# Patient Record
Sex: Male | Born: 1955 | Race: White | Hispanic: No | Marital: Single
Health system: Southern US, Community
[De-identification: ages and names within clinical notes are randomized; demographics above are authoritative.]

---

## 1999-05-24 ENCOUNTER — Encounter: Payer: Self-pay | Admitting: General Surgery

## 1999-05-24 ENCOUNTER — Encounter: Admission: RE | Admit: 1999-05-24 | Discharge: 1999-05-24 | Payer: Self-pay | Admitting: General Surgery

## 2001-09-03 ENCOUNTER — Ambulatory Visit (HOSPITAL_BASED_OUTPATIENT_CLINIC_OR_DEPARTMENT_OTHER): Admission: RE | Admit: 2001-09-03 | Discharge: 2001-09-03 | Payer: Self-pay | Admitting: Dentistry

## 2006-08-26 ENCOUNTER — Emergency Department (HOSPITAL_COMMUNITY): Admission: EM | Admit: 2006-08-26 | Discharge: 2006-08-26 | Payer: Self-pay | Admitting: Emergency Medicine

## 2008-12-29 ENCOUNTER — Emergency Department (HOSPITAL_COMMUNITY): Admission: EM | Admit: 2008-12-29 | Discharge: 2008-12-29 | Payer: Self-pay | Admitting: Emergency Medicine

## 2009-06-12 ENCOUNTER — Ambulatory Visit (HOSPITAL_COMMUNITY): Admission: RE | Admit: 2009-06-12 | Discharge: 2009-06-12 | Payer: Self-pay

## 2009-11-22 ENCOUNTER — Ambulatory Visit: Payer: Self-pay | Admitting: Thoracic Surgery

## 2009-11-27 ENCOUNTER — Ambulatory Visit (HOSPITAL_COMMUNITY): Admission: RE | Admit: 2009-11-27 | Discharge: 2009-11-27 | Payer: Self-pay | Admitting: Thoracic Surgery

## 2009-11-30 ENCOUNTER — Ambulatory Visit (HOSPITAL_COMMUNITY): Admission: RE | Admit: 2009-11-30 | Discharge: 2009-11-30 | Payer: Self-pay | Admitting: Thoracic Surgery

## 2009-12-05 ENCOUNTER — Ambulatory Visit: Payer: Self-pay | Admitting: Thoracic Surgery

## 2009-12-09 ENCOUNTER — Emergency Department (HOSPITAL_COMMUNITY): Admission: EM | Admit: 2009-12-09 | Discharge: 2009-12-09 | Payer: Self-pay | Admitting: Emergency Medicine

## 2009-12-15 ENCOUNTER — Ambulatory Visit (HOSPITAL_COMMUNITY)
Admission: RE | Admit: 2009-12-15 | Discharge: 2009-12-16 | Payer: Self-pay | Source: Home / Self Care | Admitting: Thoracic Surgery

## 2009-12-15 ENCOUNTER — Encounter: Payer: Self-pay | Admitting: Thoracic Surgery

## 2009-12-15 ENCOUNTER — Ambulatory Visit: Payer: Self-pay | Admitting: Thoracic Surgery

## 2009-12-20 ENCOUNTER — Ambulatory Visit: Payer: Self-pay | Admitting: Thoracic Surgery

## 2010-01-10 ENCOUNTER — Ambulatory Visit: Payer: Self-pay | Admitting: Thoracic Surgery

## 2010-01-22 ENCOUNTER — Ambulatory Visit (HOSPITAL_COMMUNITY)
Admission: RE | Admit: 2010-01-22 | Discharge: 2010-01-22 | Payer: Self-pay | Source: Home / Self Care | Attending: Hematology and Oncology | Admitting: Hematology and Oncology

## 2010-04-24 LAB — URINALYSIS, ROUTINE W REFLEX MICROSCOPIC
Bilirubin Urine: NEGATIVE
Hgb urine dipstick: NEGATIVE
Nitrite: NEGATIVE
Specific Gravity, Urine: 1.008 (ref 1.005–1.030)
pH: 7.5 (ref 5.0–8.0)

## 2010-04-24 LAB — COMPREHENSIVE METABOLIC PANEL
AST: 16 U/L (ref 0–37)
Albumin: 3 g/dL — ABNORMAL LOW (ref 3.5–5.2)
Alkaline Phosphatase: 73 U/L (ref 39–117)
Chloride: 105 mEq/L (ref 96–112)
GFR calc Af Amer: >60 mL/min (ref >60)
Potassium: 4.3 mEq/L (ref 3.5–5.1)
Total Bilirubin: 0.4 mg/dL (ref 0.3–1.2)

## 2010-04-24 LAB — TYPE AND SCREEN
Antibody Screen: NEGATIVE
Unit division: 0

## 2010-04-24 LAB — BLOOD GAS, ARTERIAL
Acid-Base Excess: 2.3 mmol/L — ABNORMAL HIGH (ref 0.0–2.0)
Drawn by: 333511
O2 Saturation: 96.8 %
pO2, Arterial: 87.2 mmHg (ref 80.0–100.0)

## 2010-04-24 LAB — SURGICAL PCR SCREEN
MRSA, PCR: POSITIVE — AB
Staphylococcus aureus: POSITIVE — AB

## 2010-04-24 LAB — GLUCOSE, CAPILLARY: Glucose-Capillary: 87 mg/dL (ref 70–99)

## 2010-04-24 LAB — CBC
Platelets: 330 10*3/uL (ref 150–400)
RBC: 4.3 MIL/uL (ref 4.22–5.81)
WBC: 6.1 10*3/uL (ref 4.0–10.5)

## 2010-04-24 LAB — APTT: aPTT: 36 seconds (ref 24–37)

## 2010-04-24 LAB — CULTURE, RESPIRATORY W GRAM STAIN

## 2010-04-25 LAB — CBC
HCT: 39.4 % (ref 39.0–52.0)
MCH: 27.9 pg (ref 26.0–34.0)
MCV: 86 fL (ref 78.0–100.0)
Platelets: 260 10*3/uL (ref 150–400)
RBC: 4.58 MIL/uL (ref 4.22–5.81)

## 2010-04-25 LAB — POCT I-STAT, CHEM 8
BUN: 11 mg/dL (ref 6–23)
Chloride: 102 mEq/L (ref 96–112)
Creatinine, Ser: 0.8 mg/dL (ref 0.4–1.5)
Potassium: 4.6 mEq/L (ref 3.5–5.1)
Sodium: 136 mEq/L (ref 135–145)
TCO2: 30 mmol/L (ref 0–100)

## 2010-06-26 NOTE — Letter (Signed)
December 05, 2009   C. Malon Kindle, MD  [address]  [place], [place]  [address]   Re:  DEMARKUS, REMMEL              DOB:  08/13/1955   Dear Dr. Marisa Sprinkles:   I saw the patient back today.  His PET scan showed just uptake in the  area of the large anterior mediastinal mass which is good.  His  pulmonary function tests were satisfactory.  Unfortunately, the needle  biopsy we did was nondiagnostic.  They saw some atypical cells with  lymphoid tissue, but could not make a diagnosis of cancer and requested  additional material.  For this reason, we will take him to the operating  room on December 15, 2009, to do a median sternotomy, this was a  Field seismologist procedure and do a direct biopsy of the mass, but obviously  we need to know exactly what we are dealing with, whether it is in terms  of tumor, lymphoma, thymic cancer, or thymoma prior to his surgery.   Ines Bloomer, M.D.  Electronically Signed   DPB/MEDQ  D:  12/05/2009  T:  12/06/2009  Job:  [id number]

## 2010-06-26 NOTE — Letter (Signed)
November 22, 2009   C. Malon Kindle, MD  [phone]  [place]. [place]  [address]   Re:  [name], [name]              DOB:  18-Jul-1955   Dear Dr. Marisa [name]:   I appreciate the opportunity of seeing the patient.  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  CT scan showed some  supraclavicular adenopathy and [name] large left mediastinal mass.  CT scan  of the brain was negative and he is referred here for evaluation.  He  has the left greater than right supraclavicular adenopathy on CT scan of  the neck.  He has had no hemoptysis, fever, chills, or excessive sputum.  He has had weight loss.  The CT scan of the mass shows that the left  brachial cephalic vein is encased with [name] questionable thrombus and that  also goes into the aortopulmonary window.   CURRENT MEDICATIONS:  1. Lisinopril/hydrochlorothiazide 10/12.5 for hypertension.  2. Voltaren transdermal gel for joint pain.  3. He had been on prednisone taper that has been stopped and he had      also been on nitrofurantoin caps as well as Bactrim.   PAST MEDICAL HISTORY:  He has been known to have BPH, chronic  constipation, esophageal reflux, H. pylori, neurogenic bladder,  psoriasis, [place] disease or familial HDL deficiency, and tinea cruris.  He also was found to have [name] hydrocele.   PAST SURGICAL HISTORY:  Cystoscopies, foot surgery for gangrene cyst,  and hiatal hernia repair.   FAMILY HISTORY:  Positive for hypertension, cancer, and arthritis.   SOCIAL HISTORY:  He is single.  He works as [name] Librarian, academic.  Does not  drink.  Does not smoke.   ALLERGIES:  He is allergic to Cipro and Levaquin.   REVIEW OF SYSTEMS:  VITAL SIGNS:  He is 165 pounds, he is 6 feet 1 inch  tall.  GENERAL:  He has had weight loss.  CARDIAC:  No angina or atrial fibrillation.  PULMONARY:  No hemoptysis.  GI:  Constipation, abdominal pain, and  reflux.  GU:  No kidney disease, dysuria, or frequent urination with the history  of [name] neurogenic bladder.  VASCULAR:  No claudication, DVT, or TIAs.  NEUROLOGICAL:  No dizziness, headaches, blackouts, or seizures.  MUSCULOSKELETAL:  Joint pain.  PSYCHIATRIC:  No depression or nervousness.  EYE/ENT:  No changes in eyesight or hearing.  HEMATOLOGICAL:  No problems with bleeding, clotting disorders, or  anemia.   PHYSICAL EXAMINATION:  General:  He is [name] thin Caucasian male, in no  acute distress.  Vital Signs:  His blood pressure was 148/99, pulse 100,  respirations 18, and sats are 99%.  He is 6 feet and 1 inch.  Head,  Eyes, Ears, Nose, and Throat:  Unremarkable.  Neck:  Supple without  thyromegaly.  There is no supraclavicular or axillary adenopathy.  Chest:  Clear to auscultation and percussion.  Heart:  Regular sinus  rhythm.  No murmurs.  Abdomen:  Soft.  There is no hepatosplenomegaly.  Neck:  There are some nodes in the left and right side, none that are  very enlarged.  Extremities:  Pulses are 2+.  There is no clubbing or  edema.   I think, unfortunately the patient has got probably [name] far advanced  thymic mass.  The differential diagnosis is  probably thymoma, lymphoma,  thymic cancer, teratoma, germ cell tumor, or non-small cell lung cancer.  We plan to get [name] PET scan on him and then proceed with [name] biopsy, but I  doubt that he is not [name] candidate for resection, we will probably need to  be treated with radiation and chemotherapy.   [name], M.D.  Electronically Signed   DPB/MEDQ  D:  11/22/2009  T:  11/23/2009  Job:  [id number]   cc:   Terrance Lobo, MD

## 2010-06-26 NOTE — Letter (Signed)
December 20, 2009   Malon Kindle, MD  [address]  [place], [place]  [address]   Re:  [name], [name]              DOB:  06/06/1955   Dear Dr. Marisa Sprinkles:   I saw the patient in my office today after his mediastinotomy.  His  blood pressure is 158/94, pulse 100, respirations 20, saturations were  98%.  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  I  will refer him to Dr. Gypsy Lore at the [facility] for  treatment and he will see him next week.  I will see him back again in 3  weeks to check on healing of his wound.   I appreciate the opportunity of seeing the patient.   Ines Bloomer, M.D.  Electronically Signed   DPB/MEDQ  D:  12/20/2009  T:  12/21/2009  Job:  [id number]   cc:   Josiah Lobo

## 2010-06-26 NOTE — Letter (Signed)
January 10, 2010   Silvestre Moment, MD  [address]  [place], [place] [address]   Re:  [name], [name]              DOB:  1955/08/05   Dear Dr. Gypsy Lore:   I appreciate you taking care of the patient.  I saw him back today and  his mediastinotomy site is well-healed.  Also, he has a port in place  which appears to be functioning well.  His blood pressure is 102/60,  pulse 90, respirations 18, and sats were 98%.  Apparently, he had to  forgo his second cycle of chemo because of a low white count of 680.  We  plan to, but these show no signs of infection at the present time.  We  will see him back again if he has any future problems.  Apparently, he  is undergoing some therapy for aspiration from his nerve palsy.   Ines Bloomer, M.D.  Electronically Signed   DPB/MEDQ  D:  01/10/2010  T:  01/11/2010  Job:  [id number]

## 2012-06-23 IMAGING — CT NM PET TUM IMG RESTAG (PS) SKULL BASE T - THIGH
6 series · 25 of 25 positions shown · IV contrast (350 0M)
Comparison: Diagnostic chest CT 12/09/2009.  PET of 11/27/2009.

CLINICAL DATA: Subsequent treatment strategy for lymphoma.  Status
post chemotherapy.  Last treatment finished last week.

NUCLEAR MEDICINE PET CT SKULL BASE TO THIGH
TECHNIQUE: 18.4 mCi F-18 FDG was injected intravenously via the
right AC.  Full-ring PET imaging was performed from the skull base
through the mid-thighs 70  minutes after injection.  CT data was
obtained and used for attenuation correction and anatomic
localization only.  (This was not acquired as a diagnostic CT
examination.)
Fasting Blood Glucose:  87

[Series 1: pet ac · axial · 3.3mm · 4.69mm/px · z∈[-870,+0]mm · 5 of 267 slices shown]
[im 1/267]
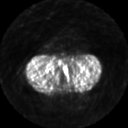
[im 67/267]
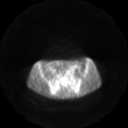
[im 134/267]
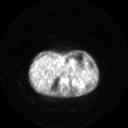
[im 200/267]
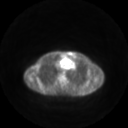
[im 267/267]
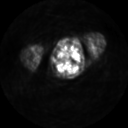

[Series 2: pet nac · axial · 3.3mm · 4.69mm/px · z∈[-870,+0]mm · 6 of 267 slices shown]
[im 1/267]
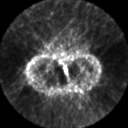
[im 54/267]
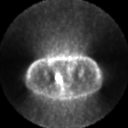
[im 107/267]
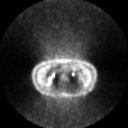
[im 160/267]
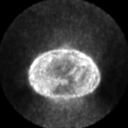
[im 213/267]
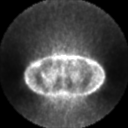
[im 267/267]
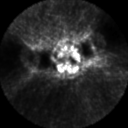

[Series 2: ct images · axial · 3.8mm · 0.98mm/px · z∈[-870,+0]mm · 6 of 267 slices shown]
[im 1/267]
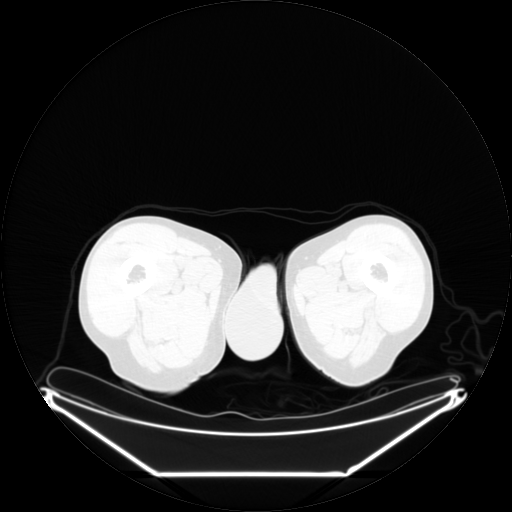
[im 54/267]
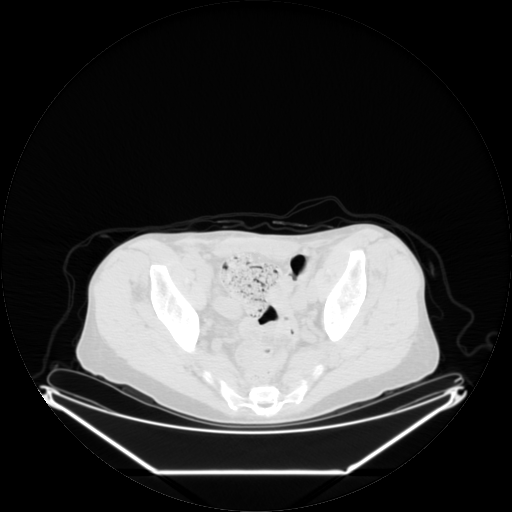
[im 107/267]
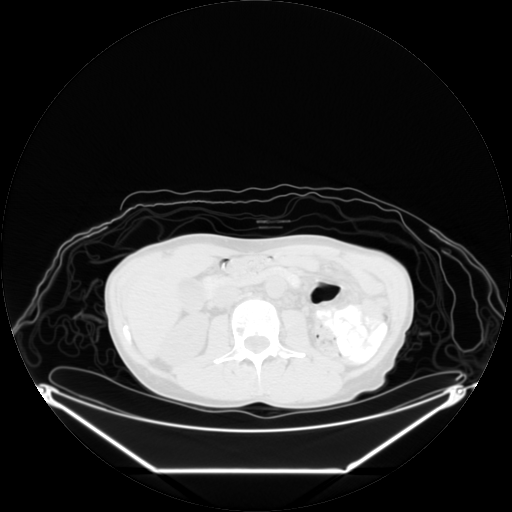
[im 160/267]
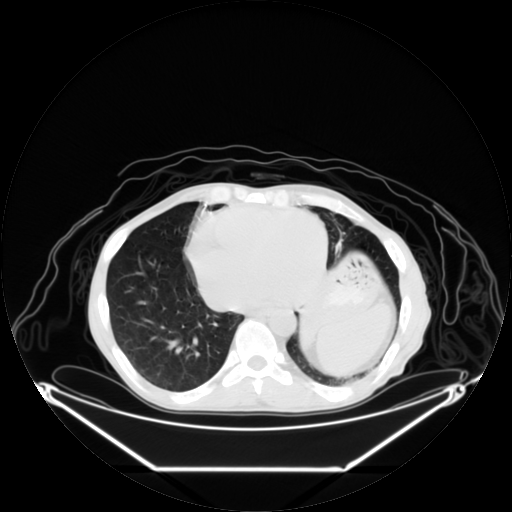
[im 213/267]
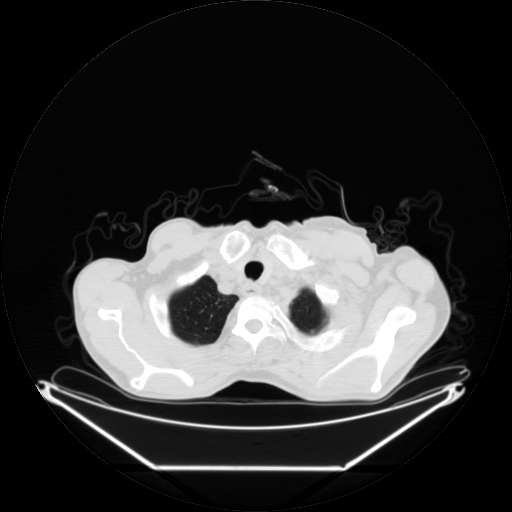
[im 267/267  brain]
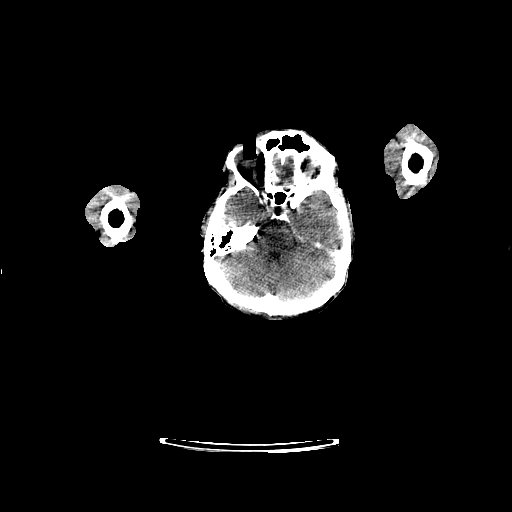

[Series 123: mip · coronal · 3.3mm · 4.69mm/px · 1 of 30 slices shown]
[im 1/30]
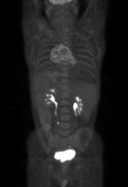

[Series 151: reformatted · axial · 3.3mm · 3.91mm/px · z∈[-870,+0]mm · 6 of 265 slices shown (1 of 2)]
[im 1/265]
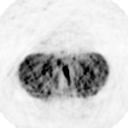
[im 53/265]
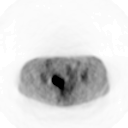
[im 106/265]
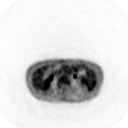
[im 159/265]
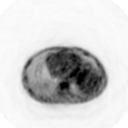
[im 212/265]
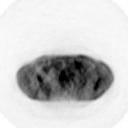
[im 265/265]
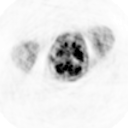

[Series 153: reformatted · coronal · 4.7mm · 6.98mm/px · 1 of 59 slices shown (2 of 2)]
[im 1/59]
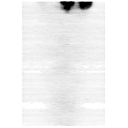

[25 of 25 positions shown; findings below may reference images not displayed]

FINDINGS: PET images demonstrate decreased asymmetry with relative
increased activity about the right cord vocal cord.  No abnormal
nodal activity within the neck.

Muscular/physiologic activity about the left pectoral musculature.

There is hypermetabolism about the skin and subcutaneous tissues of
the anterior left chest.  Possible concurrent subcutaneous
edema/increased density in this area.  This measures a S.U.V. max
of 4.1 on image 66.

The low density, heterogeneous, centrally necrotic anterior
mediastinal mass is again identified.  This measures maximally
x 6.7 cm on image 84 versus 12.0 x 7.2 cm at the same cranial
caudal level as on the prior exam.  Similar versus slight
improvement in craniocaudal extent.  This measures a S.U.V. max of
9.8 today versus between 11.0 and 12.0 on the prior exam.

At approximately the cranial caudal level of the questioned
skin/subcutaneous hypermetabolism is apparent increased or new soft
tissue density within the left internal mammary station.  Image 72.

CT images performed for attenuation correction demonstrate no
further findings within the neck.  Stable tiny left lower lobe lung
nodule at 3 mm on image 94.  Probable left renal cysts.  Minimal
left adrenal nodularity which is unchanged.  Tortuous abdominal
aorta with no change in dilatation of bilateral common iliac
arteries.  A left sided bladder diverticulum is unchanged.
Moderate bladder wall thickening.

Heterogeneous marrow density, most apparent within the pelvis.
Numerous small foci of relative hypoattenuation.  Some of these
have  sclerotic foci within.
IMPRESSION: 1.  Slight decrease in size and hypermetabolism of a centrally
necrotic anterior mediastinal mass.
2.  New hypermetabolism about the anterior left chest wall with
suspicion of concurrent subcutaneous edema/thickening.  Direct
extension of anterior mediastinal tumor is suspected.  Similarly,
subtle increased density within the left internal mammary station
is suspicious for progressive disease.
2.  No subdiaphragmatic disease identified.
3.  Similar appearance of heterogeneous marrow density within the
pelvis.  Favor aggressive osteopenia.  Not hypermetabolic to
suggest active lymphoma.  Concurrent process such as multiple
myeloma or metastatic disease could look similar.

## 2015-01-02 ENCOUNTER — Ambulatory Visit (HOSPITAL_BASED_OUTPATIENT_CLINIC_OR_DEPARTMENT_OTHER): Admit: 2015-01-02 | Payer: Self-pay | Admitting: Orthopedic Surgery

## 2015-01-02 ENCOUNTER — Other Ambulatory Visit: Payer: Self-pay | Admitting: Orthopedic Surgery

## 2015-01-02 ENCOUNTER — Encounter (HOSPITAL_BASED_OUTPATIENT_CLINIC_OR_DEPARTMENT_OTHER): Payer: Self-pay

## 2015-01-02 SURGERY — EXCISION MASS
Anesthesia: Choice | Site: Finger | Laterality: Right

## 2016-03-01 DIAGNOSIS — N319 Neuromuscular dysfunction of bladder, unspecified: Secondary | ICD-10-CM | POA: Diagnosis not present

## 2016-03-05 DIAGNOSIS — R079 Chest pain, unspecified: Secondary | ICD-10-CM | POA: Diagnosis not present

## 2016-03-15 DIAGNOSIS — M79672 Pain in left foot: Secondary | ICD-10-CM | POA: Diagnosis not present

## 2016-03-15 DIAGNOSIS — M79671 Pain in right foot: Secondary | ICD-10-CM | POA: Diagnosis not present

## 2016-03-15 DIAGNOSIS — M7742 Metatarsalgia, left foot: Secondary | ICD-10-CM | POA: Diagnosis not present

## 2016-03-15 DIAGNOSIS — M6702 Short Achilles tendon (acquired), left ankle: Secondary | ICD-10-CM | POA: Diagnosis not present

## 2016-03-21 DIAGNOSIS — L570 Actinic keratosis: Secondary | ICD-10-CM | POA: Diagnosis not present

## 2016-03-21 DIAGNOSIS — L218 Other seborrheic dermatitis: Secondary | ICD-10-CM | POA: Diagnosis not present

## 2016-03-26 DIAGNOSIS — M7741 Metatarsalgia, right foot: Secondary | ICD-10-CM | POA: Diagnosis not present

## 2016-03-26 DIAGNOSIS — M7742 Metatarsalgia, left foot: Secondary | ICD-10-CM | POA: Diagnosis not present

## 2016-03-30 DIAGNOSIS — H578 Other specified disorders of eye and adnexa: Secondary | ICD-10-CM | POA: Diagnosis not present

## 2016-04-02 DIAGNOSIS — H25813 Combined forms of age-related cataract, bilateral: Secondary | ICD-10-CM | POA: Diagnosis not present

## 2016-04-02 DIAGNOSIS — H10413 Chronic giant papillary conjunctivitis, bilateral: Secondary | ICD-10-CM | POA: Diagnosis not present

## 2016-04-08 DIAGNOSIS — C8330 Diffuse large B-cell lymphoma, unspecified site: Secondary | ICD-10-CM | POA: Diagnosis not present

## 2016-04-08 DIAGNOSIS — C8332 Diffuse large B-cell lymphoma, intrathoracic lymph nodes: Secondary | ICD-10-CM | POA: Diagnosis not present

## 2016-04-08 DIAGNOSIS — Z8579 Personal history of other malignant neoplasms of lymphoid, hematopoietic and related tissues: Secondary | ICD-10-CM | POA: Diagnosis not present

## 2016-04-24 DIAGNOSIS — M7661 Achilles tendinitis, right leg: Secondary | ICD-10-CM | POA: Diagnosis not present

## 2016-04-24 DIAGNOSIS — M7662 Achilles tendinitis, left leg: Secondary | ICD-10-CM | POA: Diagnosis not present

## 2016-05-06 DIAGNOSIS — M79671 Pain in right foot: Secondary | ICD-10-CM | POA: Diagnosis not present

## 2016-05-06 DIAGNOSIS — M79672 Pain in left foot: Secondary | ICD-10-CM | POA: Diagnosis not present

## 2016-05-08 DIAGNOSIS — M79671 Pain in right foot: Secondary | ICD-10-CM | POA: Diagnosis not present

## 2016-05-08 DIAGNOSIS — M79672 Pain in left foot: Secondary | ICD-10-CM | POA: Diagnosis not present

## 2016-05-24 DIAGNOSIS — C8332 Diffuse large B-cell lymphoma, intrathoracic lymph nodes: Secondary | ICD-10-CM | POA: Diagnosis not present

## 2016-05-24 DIAGNOSIS — Z881 Allergy status to other antibiotic agents status: Secondary | ICD-10-CM | POA: Diagnosis not present

## 2016-05-24 DIAGNOSIS — C8590 Non-Hodgkin lymphoma, unspecified, unspecified site: Secondary | ICD-10-CM | POA: Diagnosis not present

## 2016-05-24 DIAGNOSIS — M779 Enthesopathy, unspecified: Secondary | ICD-10-CM | POA: Diagnosis not present

## 2016-06-28 DIAGNOSIS — Z87440 Personal history of urinary (tract) infections: Secondary | ICD-10-CM | POA: Diagnosis not present

## 2016-07-12 DIAGNOSIS — T63441A Toxic effect of venom of bees, accidental (unintentional), initial encounter: Secondary | ICD-10-CM | POA: Diagnosis not present

## 2016-07-31 DIAGNOSIS — M542 Cervicalgia: Secondary | ICD-10-CM | POA: Diagnosis not present

## 2016-07-31 DIAGNOSIS — M5030 Other cervical disc degeneration, unspecified cervical region: Secondary | ICD-10-CM | POA: Diagnosis not present

## 2016-08-16 DIAGNOSIS — M25562 Pain in left knee: Secondary | ICD-10-CM | POA: Diagnosis not present

## 2016-08-19 DIAGNOSIS — H25813 Combined forms of age-related cataract, bilateral: Secondary | ICD-10-CM | POA: Diagnosis not present

## 2016-08-19 DIAGNOSIS — H17821 Peripheral opacity of cornea, right eye: Secondary | ICD-10-CM | POA: Diagnosis not present

## 2016-08-19 DIAGNOSIS — H04123 Dry eye syndrome of bilateral lacrimal glands: Secondary | ICD-10-CM | POA: Diagnosis not present

## 2016-09-16 DIAGNOSIS — M542 Cervicalgia: Secondary | ICD-10-CM | POA: Diagnosis not present

## 2016-10-01 DIAGNOSIS — C8332 Diffuse large B-cell lymphoma, intrathoracic lymph nodes: Secondary | ICD-10-CM | POA: Diagnosis not present

## 2016-10-01 DIAGNOSIS — R61 Generalized hyperhidrosis: Secondary | ICD-10-CM | POA: Diagnosis not present

## 2016-10-26 DIAGNOSIS — R300 Dysuria: Secondary | ICD-10-CM | POA: Diagnosis not present

## 2016-10-26 DIAGNOSIS — T83511D Infection and inflammatory reaction due to indwelling urethral catheter, subsequent encounter: Secondary | ICD-10-CM | POA: Diagnosis not present

## 2016-10-26 DIAGNOSIS — R8290 Unspecified abnormal findings in urine: Secondary | ICD-10-CM | POA: Diagnosis not present

## 2016-10-26 DIAGNOSIS — N319 Neuromuscular dysfunction of bladder, unspecified: Secondary | ICD-10-CM | POA: Diagnosis not present

## 2016-10-31 DIAGNOSIS — R300 Dysuria: Secondary | ICD-10-CM | POA: Diagnosis not present

## 2016-10-31 DIAGNOSIS — N3000 Acute cystitis without hematuria: Secondary | ICD-10-CM | POA: Diagnosis not present

## 2016-11-01 DIAGNOSIS — N419 Inflammatory disease of prostate, unspecified: Secondary | ICD-10-CM | POA: Diagnosis not present

## 2016-11-09 DIAGNOSIS — N319 Neuromuscular dysfunction of bladder, unspecified: Secondary | ICD-10-CM | POA: Diagnosis not present

## 2016-11-09 DIAGNOSIS — N390 Urinary tract infection, site not specified: Secondary | ICD-10-CM | POA: Diagnosis not present

## 2016-11-09 DIAGNOSIS — R399 Unspecified symptoms and signs involving the genitourinary system: Secondary | ICD-10-CM | POA: Diagnosis not present

## 2016-11-13 DIAGNOSIS — R300 Dysuria: Secondary | ICD-10-CM | POA: Diagnosis not present

## 2016-11-22 DIAGNOSIS — C8330 Diffuse large B-cell lymphoma, unspecified site: Secondary | ICD-10-CM | POA: Diagnosis not present

## 2016-11-22 DIAGNOSIS — Z9221 Personal history of antineoplastic chemotherapy: Secondary | ICD-10-CM | POA: Diagnosis not present

## 2016-11-22 DIAGNOSIS — C8332 Diffuse large B-cell lymphoma, intrathoracic lymph nodes: Secondary | ICD-10-CM | POA: Diagnosis not present

## 2016-12-06 DIAGNOSIS — N390 Urinary tract infection, site not specified: Secondary | ICD-10-CM | POA: Diagnosis not present

## 2016-12-06 DIAGNOSIS — N3001 Acute cystitis with hematuria: Secondary | ICD-10-CM | POA: Diagnosis not present

## 2016-12-06 DIAGNOSIS — N401 Enlarged prostate with lower urinary tract symptoms: Secondary | ICD-10-CM | POA: Diagnosis not present

## 2016-12-06 DIAGNOSIS — R309 Painful micturition, unspecified: Secondary | ICD-10-CM | POA: Diagnosis not present

## 2016-12-06 DIAGNOSIS — N138 Other obstructive and reflux uropathy: Secondary | ICD-10-CM | POA: Diagnosis not present

## 2017-01-15 DIAGNOSIS — N319 Neuromuscular dysfunction of bladder, unspecified: Secondary | ICD-10-CM | POA: Diagnosis not present

## 2017-01-23 DIAGNOSIS — Z0000 Encounter for general adult medical examination without abnormal findings: Secondary | ICD-10-CM | POA: Diagnosis not present

## 2017-01-28 DIAGNOSIS — R8290 Unspecified abnormal findings in urine: Secondary | ICD-10-CM | POA: Diagnosis not present

## 2017-02-05 DIAGNOSIS — G4700 Insomnia, unspecified: Secondary | ICD-10-CM | POA: Diagnosis not present

## 2017-02-05 DIAGNOSIS — M5030 Other cervical disc degeneration, unspecified cervical region: Secondary | ICD-10-CM | POA: Diagnosis not present

## 2017-02-05 DIAGNOSIS — E559 Vitamin D deficiency, unspecified: Secondary | ICD-10-CM | POA: Diagnosis not present

## 2017-02-05 DIAGNOSIS — Z0000 Encounter for general adult medical examination without abnormal findings: Secondary | ICD-10-CM | POA: Diagnosis not present

## 2017-02-19 DIAGNOSIS — N319 Neuromuscular dysfunction of bladder, unspecified: Secondary | ICD-10-CM | POA: Diagnosis not present

## 2017-03-09 DIAGNOSIS — R300 Dysuria: Secondary | ICD-10-CM | POA: Diagnosis not present

## 2017-03-21 DIAGNOSIS — B9789 Other viral agents as the cause of diseases classified elsewhere: Secondary | ICD-10-CM | POA: Diagnosis not present

## 2017-03-21 DIAGNOSIS — J069 Acute upper respiratory infection, unspecified: Secondary | ICD-10-CM | POA: Diagnosis not present

## 2017-03-26 DIAGNOSIS — R309 Painful micturition, unspecified: Secondary | ICD-10-CM | POA: Diagnosis not present

## 2017-03-26 DIAGNOSIS — N319 Neuromuscular dysfunction of bladder, unspecified: Secondary | ICD-10-CM | POA: Diagnosis not present

## 2017-03-28 DIAGNOSIS — N319 Neuromuscular dysfunction of bladder, unspecified: Secondary | ICD-10-CM | POA: Diagnosis not present

## 2017-03-28 DIAGNOSIS — R309 Painful micturition, unspecified: Secondary | ICD-10-CM | POA: Diagnosis not present

## 2017-03-28 DIAGNOSIS — N390 Urinary tract infection, site not specified: Secondary | ICD-10-CM | POA: Diagnosis not present

## 2017-04-07 DIAGNOSIS — N319 Neuromuscular dysfunction of bladder, unspecified: Secondary | ICD-10-CM | POA: Diagnosis not present

## 2017-04-10 DIAGNOSIS — L304 Erythema intertrigo: Secondary | ICD-10-CM | POA: Diagnosis not present

## 2017-04-23 DIAGNOSIS — N410 Acute prostatitis: Secondary | ICD-10-CM | POA: Diagnosis not present

## 2017-04-23 DIAGNOSIS — R109 Unspecified abdominal pain: Secondary | ICD-10-CM | POA: Diagnosis not present

## 2017-05-27 DIAGNOSIS — R61 Generalized hyperhidrosis: Secondary | ICD-10-CM | POA: Diagnosis not present

## 2017-05-27 DIAGNOSIS — Z8579 Personal history of other malignant neoplasms of lymphoid, hematopoietic and related tissues: Secondary | ICD-10-CM | POA: Diagnosis not present

## 2017-05-28 DIAGNOSIS — N390 Urinary tract infection, site not specified: Secondary | ICD-10-CM | POA: Diagnosis not present

## 2017-05-28 DIAGNOSIS — N319 Neuromuscular dysfunction of bladder, unspecified: Secondary | ICD-10-CM | POA: Diagnosis not present

## 2017-06-22 DIAGNOSIS — Z0000 Encounter for general adult medical examination without abnormal findings: Secondary | ICD-10-CM | POA: Diagnosis not present

## 2017-06-22 DIAGNOSIS — C8332 Diffuse large B-cell lymphoma, intrathoracic lymph nodes: Secondary | ICD-10-CM | POA: Diagnosis not present

## 2017-06-26 DIAGNOSIS — N319 Neuromuscular dysfunction of bladder, unspecified: Secondary | ICD-10-CM | POA: Diagnosis not present

## 2017-06-26 DIAGNOSIS — I10 Essential (primary) hypertension: Secondary | ICD-10-CM | POA: Diagnosis not present

## 2017-07-03 DIAGNOSIS — C8332 Diffuse large B-cell lymphoma, intrathoracic lymph nodes: Secondary | ICD-10-CM | POA: Diagnosis not present

## 2017-07-07 DIAGNOSIS — R110 Nausea: Secondary | ICD-10-CM | POA: Diagnosis not present

## 2017-07-07 DIAGNOSIS — R197 Diarrhea, unspecified: Secondary | ICD-10-CM | POA: Diagnosis not present

## 2017-07-09 DIAGNOSIS — R197 Diarrhea, unspecified: Secondary | ICD-10-CM | POA: Diagnosis not present

## 2017-07-09 DIAGNOSIS — R110 Nausea: Secondary | ICD-10-CM | POA: Diagnosis not present

## 2017-07-10 DIAGNOSIS — R110 Nausea: Secondary | ICD-10-CM | POA: Diagnosis not present

## 2017-07-10 DIAGNOSIS — R197 Diarrhea, unspecified: Secondary | ICD-10-CM | POA: Diagnosis not present

## 2017-07-19 DIAGNOSIS — M25562 Pain in left knee: Secondary | ICD-10-CM | POA: Diagnosis not present

## 2017-07-20 DIAGNOSIS — N319 Neuromuscular dysfunction of bladder, unspecified: Secondary | ICD-10-CM | POA: Diagnosis not present

## 2017-07-20 DIAGNOSIS — N390 Urinary tract infection, site not specified: Secondary | ICD-10-CM | POA: Diagnosis not present

## 2017-07-20 DIAGNOSIS — T83511D Infection and inflammatory reaction due to indwelling urethral catheter, subsequent encounter: Secondary | ICD-10-CM | POA: Diagnosis not present

## 2017-07-21 DIAGNOSIS — N319 Neuromuscular dysfunction of bladder, unspecified: Secondary | ICD-10-CM | POA: Diagnosis not present

## 2017-07-21 DIAGNOSIS — N390 Urinary tract infection, site not specified: Secondary | ICD-10-CM | POA: Diagnosis not present

## 2017-07-21 DIAGNOSIS — R8290 Unspecified abnormal findings in urine: Secondary | ICD-10-CM | POA: Diagnosis not present

## 2017-07-26 DIAGNOSIS — M25562 Pain in left knee: Secondary | ICD-10-CM | POA: Diagnosis not present

## 2017-08-02 DIAGNOSIS — M25562 Pain in left knee: Secondary | ICD-10-CM | POA: Diagnosis not present

## 2017-08-06 DIAGNOSIS — M25562 Pain in left knee: Secondary | ICD-10-CM | POA: Diagnosis not present

## 2017-08-24 DIAGNOSIS — R350 Frequency of micturition: Secondary | ICD-10-CM | POA: Diagnosis not present

## 2017-08-24 DIAGNOSIS — N3090 Cystitis, unspecified without hematuria: Secondary | ICD-10-CM | POA: Diagnosis not present

## 2017-08-24 DIAGNOSIS — N390 Urinary tract infection, site not specified: Secondary | ICD-10-CM | POA: Diagnosis not present

## 2017-08-24 DIAGNOSIS — N319 Neuromuscular dysfunction of bladder, unspecified: Secondary | ICD-10-CM | POA: Diagnosis not present

## 2017-08-25 DIAGNOSIS — B964 Proteus (mirabilis) (morganii) as the cause of diseases classified elsewhere: Secondary | ICD-10-CM | POA: Diagnosis not present

## 2017-08-25 DIAGNOSIS — N390 Urinary tract infection, site not specified: Secondary | ICD-10-CM | POA: Diagnosis not present

## 2017-08-25 DIAGNOSIS — N319 Neuromuscular dysfunction of bladder, unspecified: Secondary | ICD-10-CM | POA: Diagnosis not present

## 2017-09-01 DIAGNOSIS — H25813 Combined forms of age-related cataract, bilateral: Secondary | ICD-10-CM | POA: Diagnosis not present

## 2017-09-01 DIAGNOSIS — H04123 Dry eye syndrome of bilateral lacrimal glands: Secondary | ICD-10-CM | POA: Diagnosis not present

## 2017-09-01 DIAGNOSIS — H17821 Peripheral opacity of cornea, right eye: Secondary | ICD-10-CM | POA: Diagnosis not present

## 2017-09-07 DIAGNOSIS — N319 Neuromuscular dysfunction of bladder, unspecified: Secondary | ICD-10-CM | POA: Diagnosis not present

## 2017-09-07 DIAGNOSIS — N390 Urinary tract infection, site not specified: Secondary | ICD-10-CM | POA: Diagnosis not present

## 2017-09-10 DIAGNOSIS — N390 Urinary tract infection, site not specified: Secondary | ICD-10-CM | POA: Diagnosis not present

## 2017-09-17 DIAGNOSIS — R300 Dysuria: Secondary | ICD-10-CM | POA: Diagnosis not present

## 2017-09-24 DIAGNOSIS — H17821 Peripheral opacity of cornea, right eye: Secondary | ICD-10-CM | POA: Diagnosis not present

## 2017-09-24 DIAGNOSIS — H04123 Dry eye syndrome of bilateral lacrimal glands: Secondary | ICD-10-CM | POA: Diagnosis not present

## 2017-09-24 DIAGNOSIS — H25813 Combined forms of age-related cataract, bilateral: Secondary | ICD-10-CM | POA: Diagnosis not present

## 2017-10-01 DIAGNOSIS — L570 Actinic keratosis: Secondary | ICD-10-CM | POA: Diagnosis not present

## 2017-10-01 DIAGNOSIS — L578 Other skin changes due to chronic exposure to nonionizing radiation: Secondary | ICD-10-CM | POA: Diagnosis not present

## 2017-10-02 DIAGNOSIS — N319 Neuromuscular dysfunction of bladder, unspecified: Secondary | ICD-10-CM | POA: Diagnosis not present

## 2017-10-02 DIAGNOSIS — R300 Dysuria: Secondary | ICD-10-CM | POA: Diagnosis not present

## 2017-10-02 DIAGNOSIS — N390 Urinary tract infection, site not specified: Secondary | ICD-10-CM | POA: Diagnosis not present

## 2017-10-06 DIAGNOSIS — R8290 Unspecified abnormal findings in urine: Secondary | ICD-10-CM | POA: Diagnosis not present

## 2017-10-06 DIAGNOSIS — B952 Enterococcus as the cause of diseases classified elsewhere: Secondary | ICD-10-CM | POA: Diagnosis not present

## 2017-10-06 DIAGNOSIS — N319 Neuromuscular dysfunction of bladder, unspecified: Secondary | ICD-10-CM | POA: Diagnosis not present

## 2017-10-06 DIAGNOSIS — R300 Dysuria: Secondary | ICD-10-CM | POA: Diagnosis not present

## 2017-10-06 DIAGNOSIS — N390 Urinary tract infection, site not specified: Secondary | ICD-10-CM | POA: Diagnosis not present

## 2017-10-10 DIAGNOSIS — R079 Chest pain, unspecified: Secondary | ICD-10-CM | POA: Diagnosis not present

## 2017-10-10 DIAGNOSIS — R202 Paresthesia of skin: Secondary | ICD-10-CM | POA: Diagnosis not present

## 2017-10-10 DIAGNOSIS — R0602 Shortness of breath: Secondary | ICD-10-CM | POA: Diagnosis not present

## 2017-10-10 DIAGNOSIS — R6884 Jaw pain: Secondary | ICD-10-CM | POA: Diagnosis not present

## 2017-10-14 DIAGNOSIS — R9431 Abnormal electrocardiogram [ECG] [EKG]: Secondary | ICD-10-CM | POA: Diagnosis not present

## 2017-10-14 DIAGNOSIS — I493 Ventricular premature depolarization: Secondary | ICD-10-CM | POA: Diagnosis not present

## 2017-10-14 DIAGNOSIS — I454 Nonspecific intraventricular block: Secondary | ICD-10-CM | POA: Diagnosis not present

## 2017-10-28 DIAGNOSIS — R079 Chest pain, unspecified: Secondary | ICD-10-CM | POA: Diagnosis not present

## 2017-10-28 DIAGNOSIS — L304 Erythema intertrigo: Secondary | ICD-10-CM | POA: Diagnosis not present

## 2017-11-05 DIAGNOSIS — R8290 Unspecified abnormal findings in urine: Secondary | ICD-10-CM | POA: Diagnosis not present

## 2017-11-05 DIAGNOSIS — N319 Neuromuscular dysfunction of bladder, unspecified: Secondary | ICD-10-CM | POA: Diagnosis not present

## 2017-11-05 DIAGNOSIS — N390 Urinary tract infection, site not specified: Secondary | ICD-10-CM | POA: Diagnosis not present

## 2017-11-06 DIAGNOSIS — L570 Actinic keratosis: Secondary | ICD-10-CM | POA: Diagnosis not present

## 2017-11-14 DIAGNOSIS — R300 Dysuria: Secondary | ICD-10-CM | POA: Diagnosis not present

## 2017-11-14 DIAGNOSIS — R8290 Unspecified abnormal findings in urine: Secondary | ICD-10-CM | POA: Diagnosis not present

## 2017-12-06 DIAGNOSIS — N319 Neuromuscular dysfunction of bladder, unspecified: Secondary | ICD-10-CM | POA: Diagnosis not present

## 2017-12-06 DIAGNOSIS — N390 Urinary tract infection, site not specified: Secondary | ICD-10-CM | POA: Diagnosis not present

## 2017-12-06 DIAGNOSIS — R300 Dysuria: Secondary | ICD-10-CM | POA: Diagnosis not present

## 2017-12-26 DIAGNOSIS — M25562 Pain in left knee: Secondary | ICD-10-CM | POA: Diagnosis not present

## 2017-12-30 DIAGNOSIS — L602 Onychogryphosis: Secondary | ICD-10-CM | POA: Diagnosis not present

## 2017-12-30 DIAGNOSIS — L603 Nail dystrophy: Secondary | ICD-10-CM | POA: Diagnosis not present

## 2018-01-05 DIAGNOSIS — C8332 Diffuse large B-cell lymphoma, intrathoracic lymph nodes: Secondary | ICD-10-CM | POA: Diagnosis not present

## 2018-01-05 DIAGNOSIS — D72819 Decreased white blood cell count, unspecified: Secondary | ICD-10-CM | POA: Diagnosis not present

## 2018-01-05 DIAGNOSIS — Z9221 Personal history of antineoplastic chemotherapy: Secondary | ICD-10-CM | POA: Diagnosis not present

## 2018-01-14 DIAGNOSIS — H17821 Peripheral opacity of cornea, right eye: Secondary | ICD-10-CM | POA: Diagnosis not present

## 2018-01-14 DIAGNOSIS — H25813 Combined forms of age-related cataract, bilateral: Secondary | ICD-10-CM | POA: Diagnosis not present

## 2018-01-14 DIAGNOSIS — H04123 Dry eye syndrome of bilateral lacrimal glands: Secondary | ICD-10-CM | POA: Diagnosis not present

## 2018-01-20 DIAGNOSIS — L602 Onychogryphosis: Secondary | ICD-10-CM | POA: Diagnosis not present

## 2018-01-30 DIAGNOSIS — M25562 Pain in left knee: Secondary | ICD-10-CM | POA: Diagnosis not present

## 2018-01-31 DIAGNOSIS — Z0000 Encounter for general adult medical examination without abnormal findings: Secondary | ICD-10-CM | POA: Diagnosis not present

## 2018-01-31 DIAGNOSIS — Z125 Encounter for screening for malignant neoplasm of prostate: Secondary | ICD-10-CM | POA: Diagnosis not present

## 2018-02-06 DIAGNOSIS — Z23 Encounter for immunization: Secondary | ICD-10-CM | POA: Diagnosis not present

## 2018-02-06 DIAGNOSIS — Z0000 Encounter for general adult medical examination without abnormal findings: Secondary | ICD-10-CM | POA: Diagnosis not present

## 2018-02-06 DIAGNOSIS — N319 Neuromuscular dysfunction of bladder, unspecified: Secondary | ICD-10-CM | POA: Diagnosis not present

## 2018-02-06 DIAGNOSIS — R8290 Unspecified abnormal findings in urine: Secondary | ICD-10-CM | POA: Diagnosis not present

## 2018-02-06 DIAGNOSIS — K219 Gastro-esophageal reflux disease without esophagitis: Secondary | ICD-10-CM | POA: Diagnosis not present

## 2018-02-06 DIAGNOSIS — I10 Essential (primary) hypertension: Secondary | ICD-10-CM | POA: Diagnosis not present

## 2018-02-06 DIAGNOSIS — M5030 Other cervical disc degeneration, unspecified cervical region: Secondary | ICD-10-CM | POA: Diagnosis not present

## 2018-02-12 DIAGNOSIS — H2512 Age-related nuclear cataract, left eye: Secondary | ICD-10-CM | POA: Diagnosis not present

## 2018-02-12 DIAGNOSIS — H268 Other specified cataract: Secondary | ICD-10-CM | POA: Diagnosis not present

## 2018-02-23 DIAGNOSIS — H2511 Age-related nuclear cataract, right eye: Secondary | ICD-10-CM | POA: Diagnosis not present

## 2018-02-24 DIAGNOSIS — M25562 Pain in left knee: Secondary | ICD-10-CM | POA: Diagnosis not present

## 2018-02-26 DIAGNOSIS — H2511 Age-related nuclear cataract, right eye: Secondary | ICD-10-CM | POA: Diagnosis not present

## 2018-03-30 DIAGNOSIS — M25562 Pain in left knee: Secondary | ICD-10-CM | POA: Diagnosis not present

## 2018-04-10 DIAGNOSIS — M25562 Pain in left knee: Secondary | ICD-10-CM | POA: Diagnosis not present

## 2018-04-18 DIAGNOSIS — N390 Urinary tract infection, site not specified: Secondary | ICD-10-CM | POA: Diagnosis not present

## 2018-04-18 DIAGNOSIS — R300 Dysuria: Secondary | ICD-10-CM | POA: Diagnosis not present

## 2018-04-18 DIAGNOSIS — N319 Neuromuscular dysfunction of bladder, unspecified: Secondary | ICD-10-CM | POA: Diagnosis not present

## 2018-04-19 DIAGNOSIS — N390 Urinary tract infection, site not specified: Secondary | ICD-10-CM | POA: Diagnosis not present

## 2018-04-19 DIAGNOSIS — R8290 Unspecified abnormal findings in urine: Secondary | ICD-10-CM | POA: Diagnosis not present

## 2018-04-19 DIAGNOSIS — N319 Neuromuscular dysfunction of bladder, unspecified: Secondary | ICD-10-CM | POA: Diagnosis not present

## 2018-04-19 DIAGNOSIS — Z960 Presence of urogenital implants: Secondary | ICD-10-CM | POA: Diagnosis not present

## 2018-04-21 DIAGNOSIS — N319 Neuromuscular dysfunction of bladder, unspecified: Secondary | ICD-10-CM | POA: Diagnosis not present

## 2018-04-21 DIAGNOSIS — N390 Urinary tract infection, site not specified: Secondary | ICD-10-CM | POA: Diagnosis not present

## 2018-05-04 DIAGNOSIS — N319 Neuromuscular dysfunction of bladder, unspecified: Secondary | ICD-10-CM | POA: Diagnosis not present

## 2018-05-04 DIAGNOSIS — R8290 Unspecified abnormal findings in urine: Secondary | ICD-10-CM | POA: Diagnosis not present

## 2018-05-04 DIAGNOSIS — T83511A Infection and inflammatory reaction due to indwelling urethral catheter, initial encounter: Secondary | ICD-10-CM | POA: Diagnosis not present

## 2018-05-15 DIAGNOSIS — R300 Dysuria: Secondary | ICD-10-CM | POA: Diagnosis not present

## 2018-05-22 DIAGNOSIS — R399 Unspecified symptoms and signs involving the genitourinary system: Secondary | ICD-10-CM | POA: Diagnosis not present

## 2018-05-22 DIAGNOSIS — R300 Dysuria: Secondary | ICD-10-CM | POA: Diagnosis not present

## 2018-05-22 DIAGNOSIS — N319 Neuromuscular dysfunction of bladder, unspecified: Secondary | ICD-10-CM | POA: Diagnosis not present

## 2018-06-15 DIAGNOSIS — M25562 Pain in left knee: Secondary | ICD-10-CM | POA: Diagnosis not present

## 2019-03-31 ENCOUNTER — Other Ambulatory Visit (HOSPITAL_COMMUNITY): Payer: Self-pay | Admitting: Orthopedic Surgery

## 2019-03-31 ENCOUNTER — Ambulatory Visit (HOSPITAL_COMMUNITY)
Admission: RE | Admit: 2019-03-31 | Discharge: 2019-03-31 | Disposition: A | Discharge status: Home / Self Care | Payer: 59 | Source: Ambulatory Visit | Attending: Cardiology | Admitting: Cardiology

## 2019-03-31 ENCOUNTER — Other Ambulatory Visit: Payer: Self-pay

## 2019-03-31 DIAGNOSIS — M79662 Pain in left lower leg: Secondary | ICD-10-CM

## 2019-03-31 DIAGNOSIS — M79605 Pain in left leg: Secondary | ICD-10-CM

## 2019-04-15 ENCOUNTER — Ambulatory Visit: Payer: 59 | Attending: Family

## 2019-04-15 DIAGNOSIS — Z23 Encounter for immunization: Secondary | ICD-10-CM

## 2019-04-15 NOTE — Progress Notes (Signed)
   Covid-19 Vaccination Clinic  Name:  [name]    MRN: [id number] DOB: December 13, 1955  04/15/2019  [name] was observed post Covid-19 immunization for 15 minutes without incident. He was provided with Vaccine Information Sheet and instruction to access the V-Safe system.   [name] was instructed to call 911 with any severe reactions post vaccine: Marland Kitchen Difficulty breathing  . Swelling of face and throat  . A fast heartbeat  . A bad rash all over body  . Dizziness and weakness   Immunizations Administered    Name Date Dose VIS Date Route   Moderna COVID-19 Vaccine 04/15/2019  2:47 PM 0.5 mL 01/12/2019 Intramuscular   Manufacturer: Moderna   Lot: [id number]   [id number]

## 2019-05-18 ENCOUNTER — Ambulatory Visit: Payer: 59

## 2019-05-25 ENCOUNTER — Ambulatory Visit: Payer: 59 | Attending: Family

## 2019-05-25 DIAGNOSIS — Z23 Encounter for immunization: Secondary | ICD-10-CM

## 2019-05-25 NOTE — Progress Notes (Signed)
   Covid-19 Vaccination Clinic  Name:  SHAMARR MCWHORTER    MRN: [id number] DOB: [birth year]  05/25/2019  Mr. Armitage was observed post Covid-19 immunization for 15 minutes without incident. He was provided with Vaccine Information Sheet and instruction to access the V-Safe system.   Mr. Cicotte was instructed to call 911 with any severe reactions post vaccine: Marland Kitchen Difficulty breathing  . Swelling of face and throat  . A fast heartbeat  . A bad rash all over body  . Dizziness and weakness   Immunizations Administered    Name Date Dose VIS Date Route   Moderna COVID-19 Vaccine 05/25/2019  4:42 PM 0.5 mL 01/12/2019 Intramuscular   Manufacturer: Moderna   Lot: [id number]   [id number]

## 2020-02-18 ENCOUNTER — Other Ambulatory Visit: Payer: 59

## 2020-07-06 ENCOUNTER — Ambulatory Visit: Payer: 59 | Admitting: Psychology
